# Patient Record
Sex: Female | Born: 1995 | Race: Black or African American | Hispanic: No | Marital: Single | Smoking: Never smoker
Health system: Southern US, Community
[De-identification: ages and names within clinical notes are randomized; demographics above are authoritative.]

---
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
## 2013-02-04 DIAGNOSIS — Z789 Other specified health status: Secondary | ICD-10-CM | POA: Insufficient documentation

## 2014-08-23 IMAGING — CT CT ABD-PELV W/ CM
2 of 4 series · 15 of 46 positions shown, 17 images · IV contrast (APPLIED)
Comparison: None.

CLINICAL DATA: Abdominal pain

EXAM:
CT ABDOMEN AND PELVIS WITH CONTRAST
TECHNIQUE: Multidetector CT imaging of the abdomen and pelvis was performed
using the standard protocol following bolus administration of
intravenous contrast.
CONTRAST:  100mL OMNIPAQUE IOHEXOL 300 MG/ML  SOLN

[Series 2: abd/ pelvis 5.0 i30f 1 · axial · 0.65mm/px · z∈[-461,-21]mm · 12 of 101 slices shown, 14 images]
[im 9/101  soft-tissue]
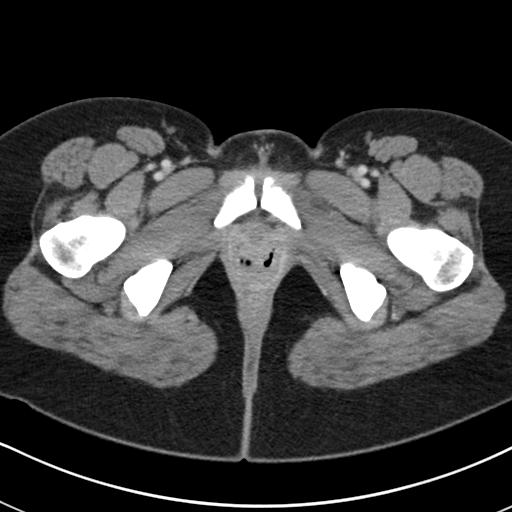
[im 9/101  bone]
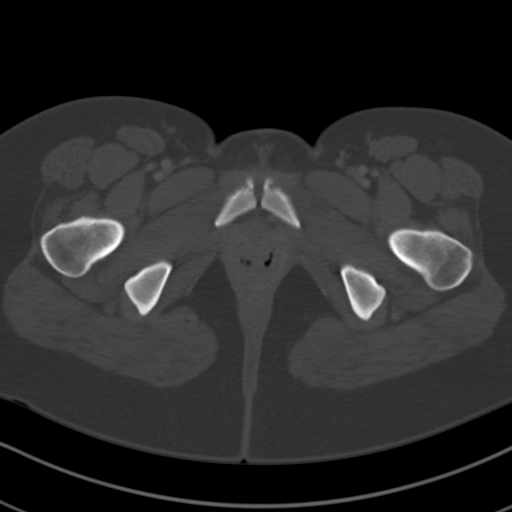
[im 17/101  soft-tissue]
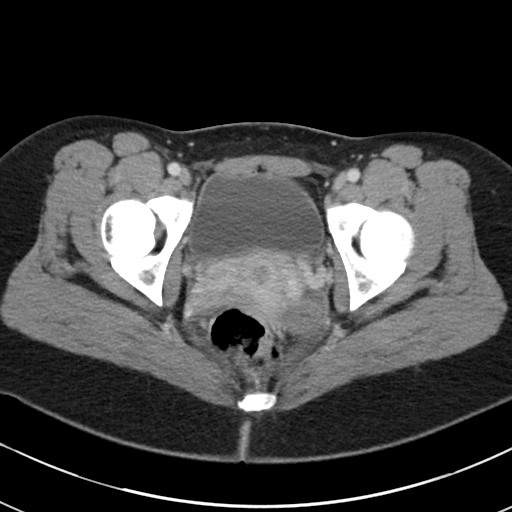
[im 25/101  soft-tissue]
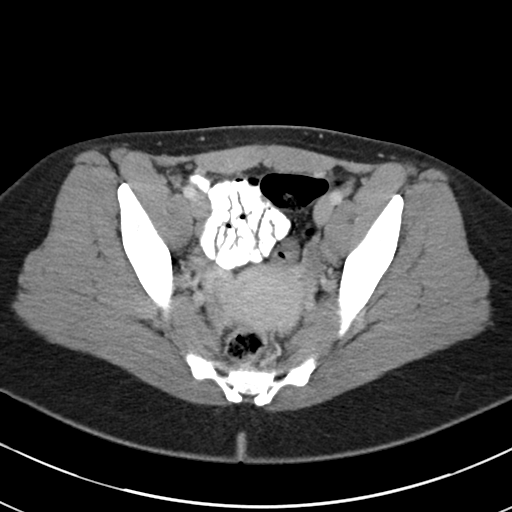
[im 33/101  soft-tissue]
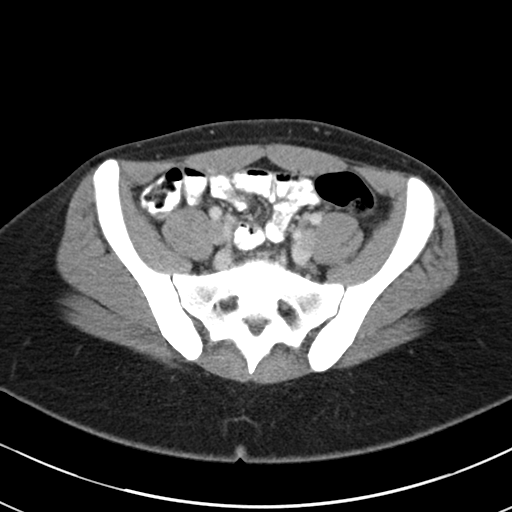
[im 41/101  soft-tissue]
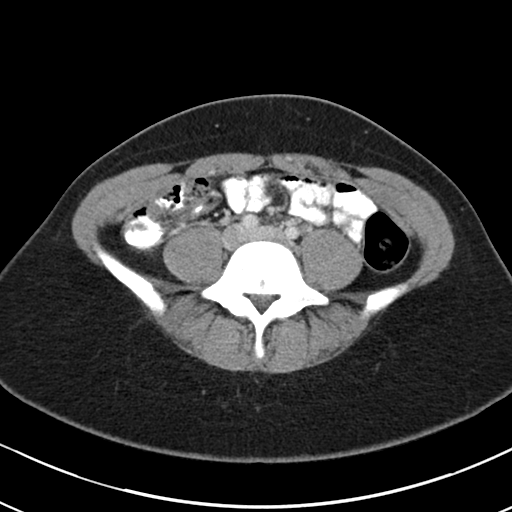
[im 49/101  soft-tissue]
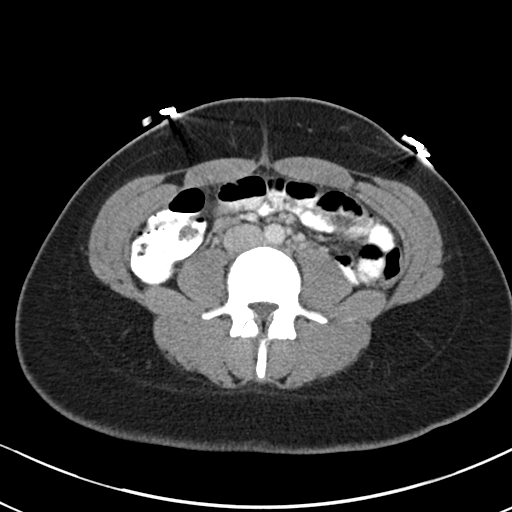
[im 57/101  soft-tissue]
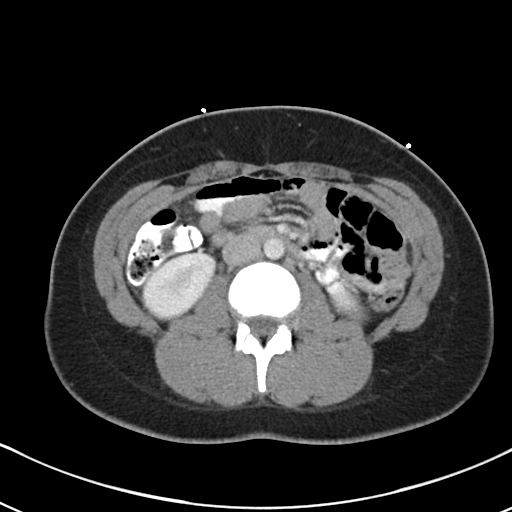
[im 65/101  soft-tissue]
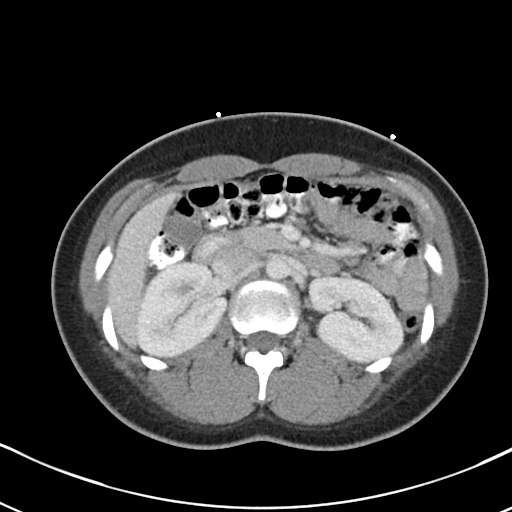
[im 73/101  soft-tissue]
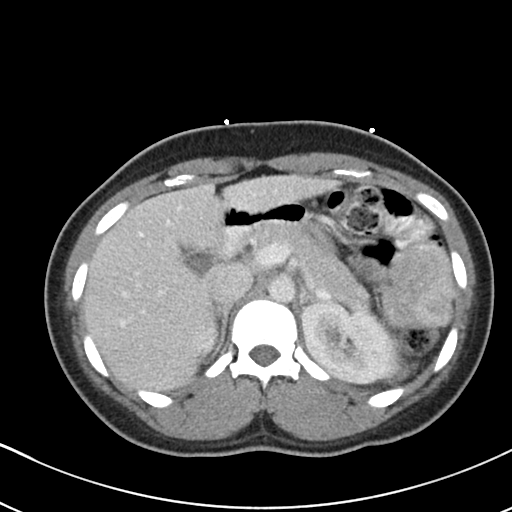
[im 73/101  bone]
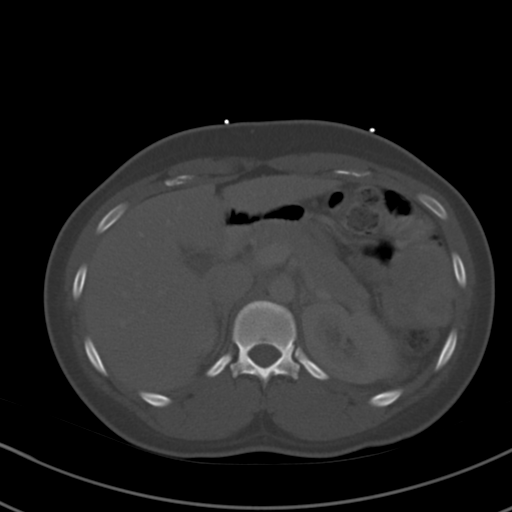
[im 81/101  soft-tissue]
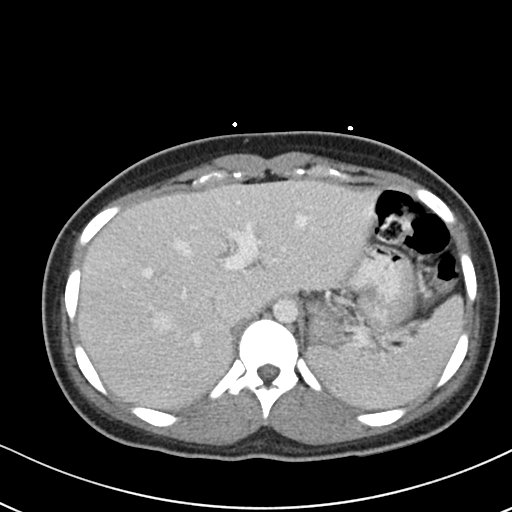
[im 89/101  soft-tissue]
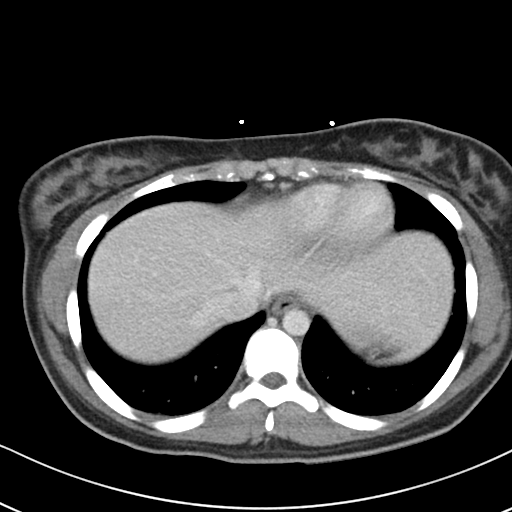
[im 97/101  soft-tissue]
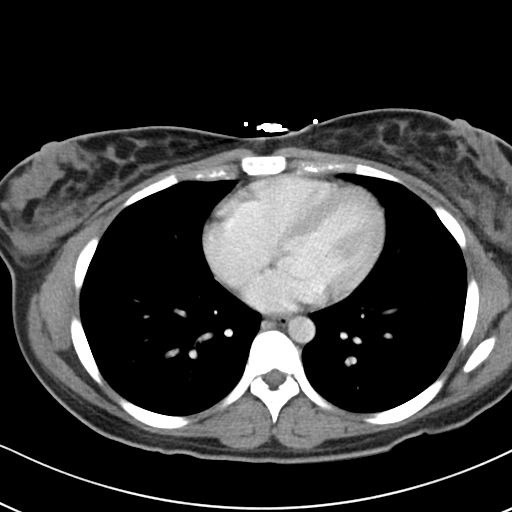

[Series 5: cor · coronal · 0.66mm/px · 3 of 99 slices shown]
[im 33/99  soft-tissue]
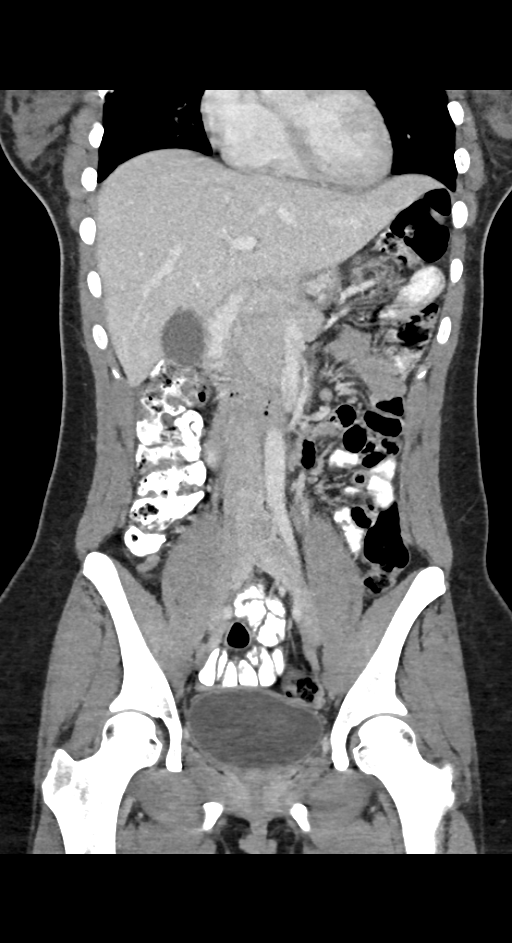
[im 44/99  soft-tissue]
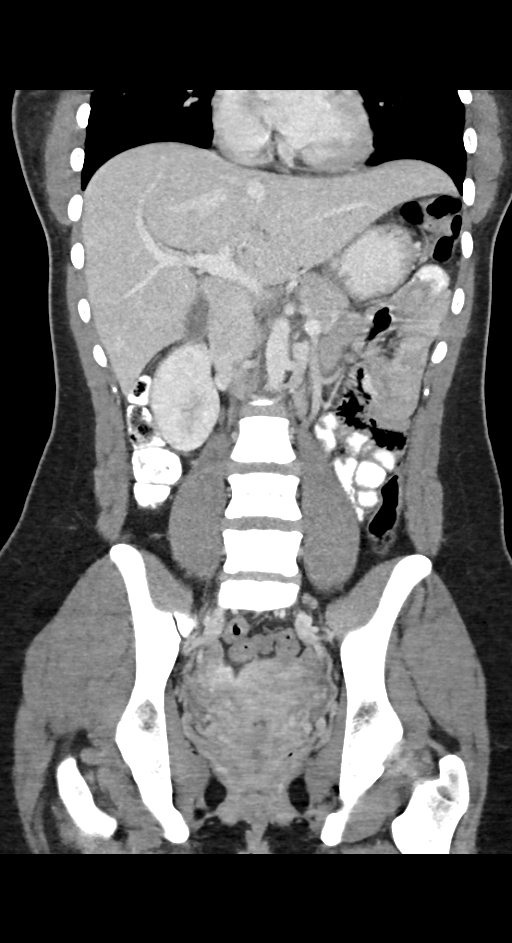
[im 55/99  soft-tissue]
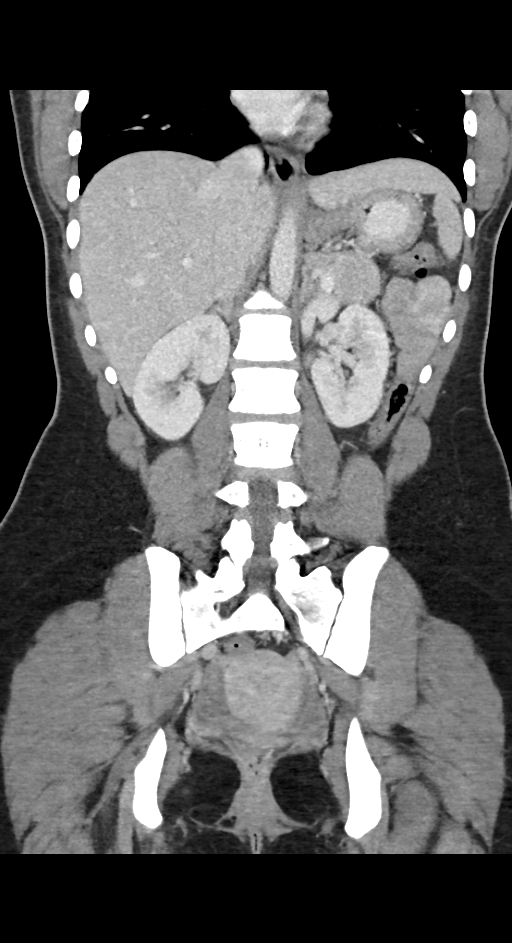

[15 of 46 positions shown; findings below may reference images not displayed]

FINDINGS: BODY WALL: Unremarkable.

LOWER CHEST:

Mediastinum: Unremarkable.

Lungs/pleura: No consolidation.

ABDOMEN/PELVIS:

Liver: No focal abnormality.

Biliary: No evidence of biliary obstruction or stone.

Pancreas: Unremarkable.

Spleen: Unremarkable.

Adrenals: Unremarkable.

Kidneys and ureters: No hydronephrosis or stone.

Bladder: Unremarkable.

Bowel: The sigmoid colon has borderline wall thickening, but there
is no history of diarrhea. No obstruction. Normal appendix.

Retroperitoneum: No mass or adenopathy.

Peritoneum: There is trace free pelvic fluid, often physiologic.

Reproductive: Unremarkable.

Vascular: No acute abnormality.

OSSEOUS: No acute abnormalities. Mild L5-S1 retrolisthesis.
IMPRESSION: Negative for acute intra-abdominal abnormality.

## 2014-08-23 IMAGING — US US ABDOMEN COMPLETE
1 series · 14 of 25 positions shown · non-contrast
Comparison: None.

CLINICAL DATA: Right upper quadrant pain

EXAM:
ULTRASOUND ABDOMEN COMPLETE

[Series 1: us abdomen complete · 0.20mm/px · 14 of 99 slices shown]
[im 1/99]
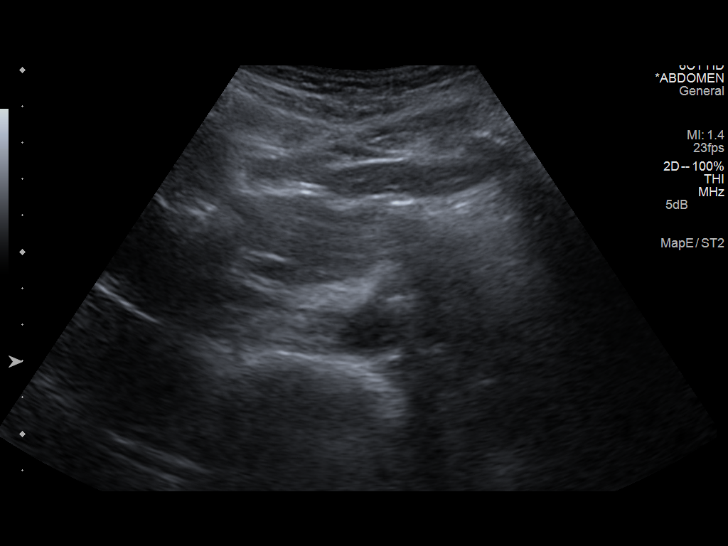
[im 9/99]
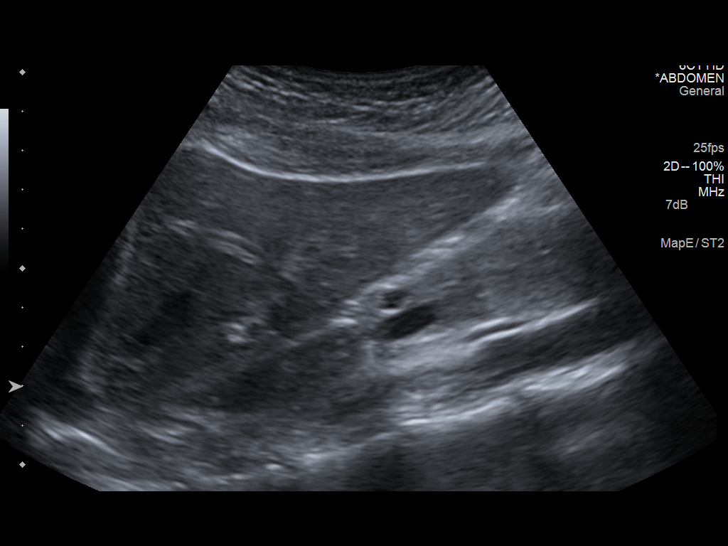
[im 17/99]
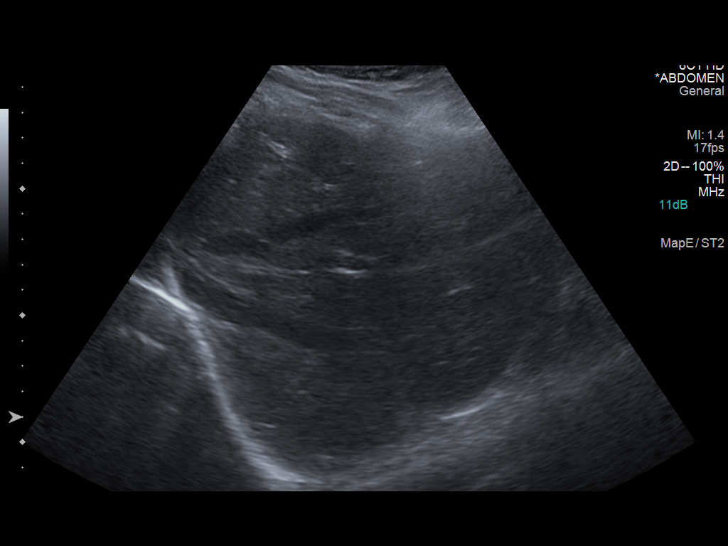
[im 25/99]
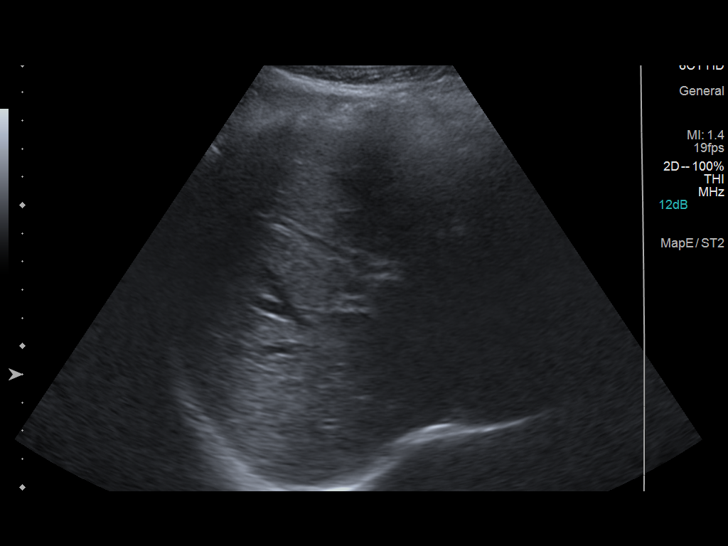
[im 33/99]
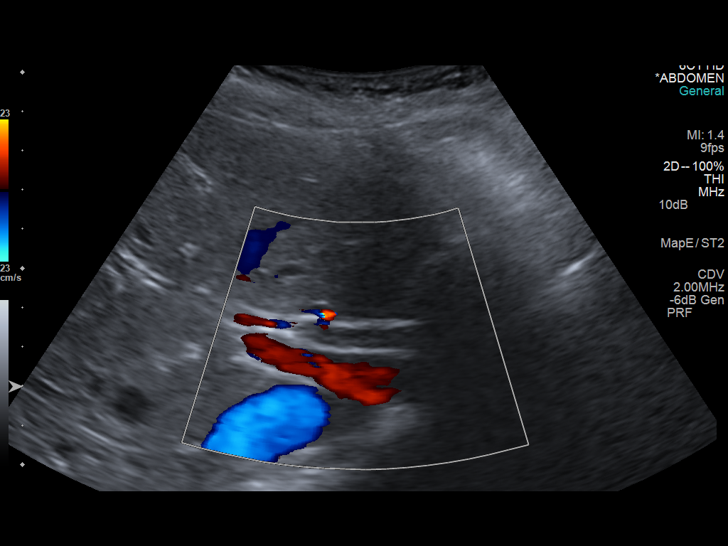
[im 37/99]
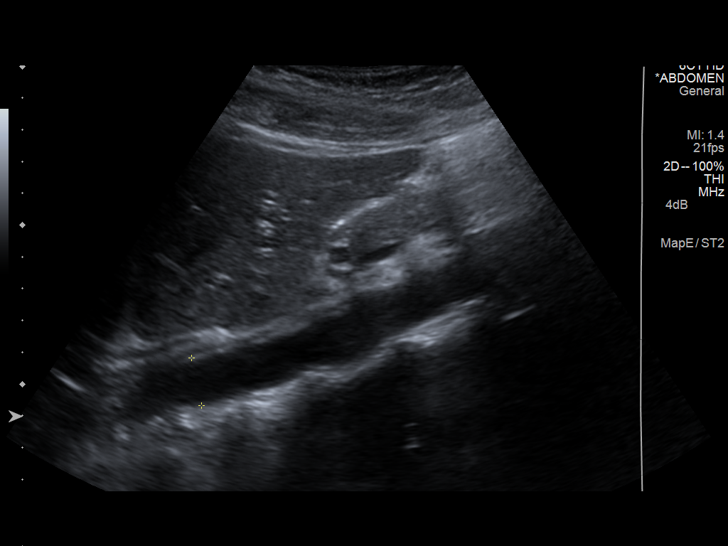
[im 45/99]
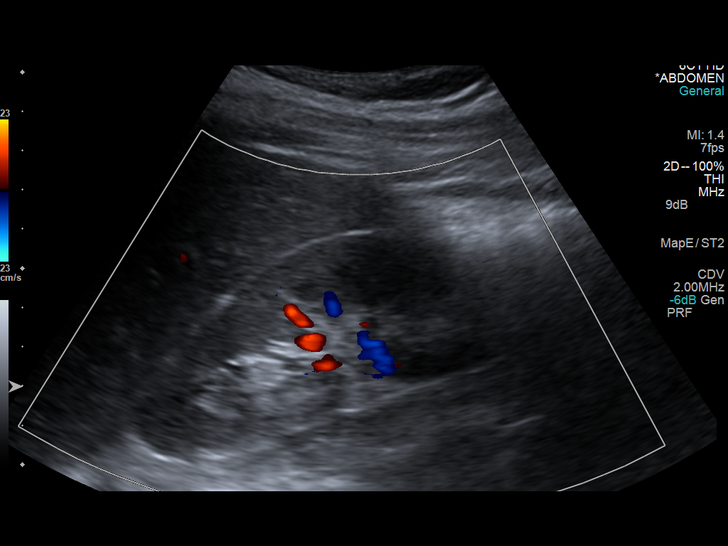
[im 54/99]
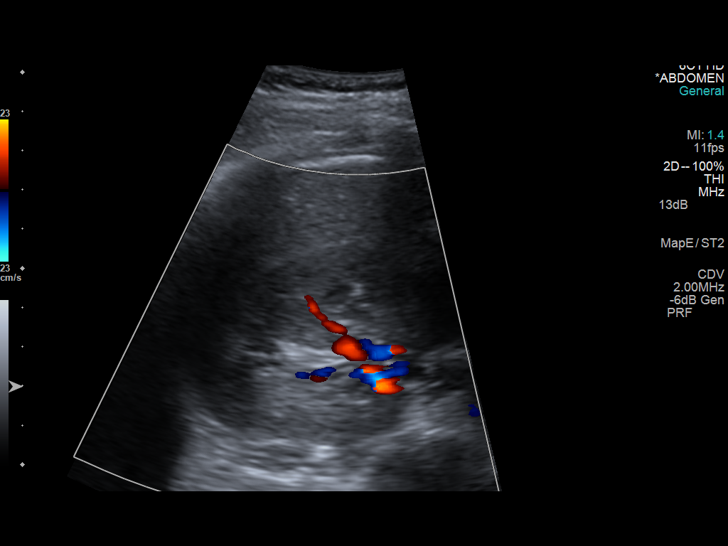
[im 62/99]
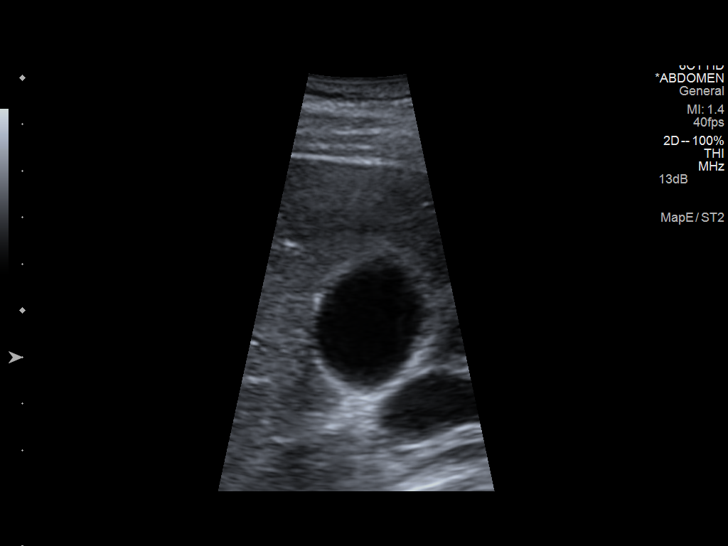
[im 66/99]
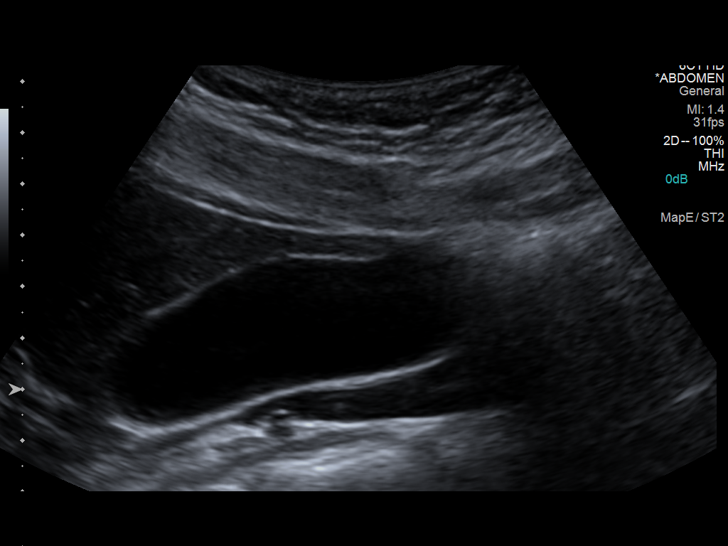
[im 74/99]
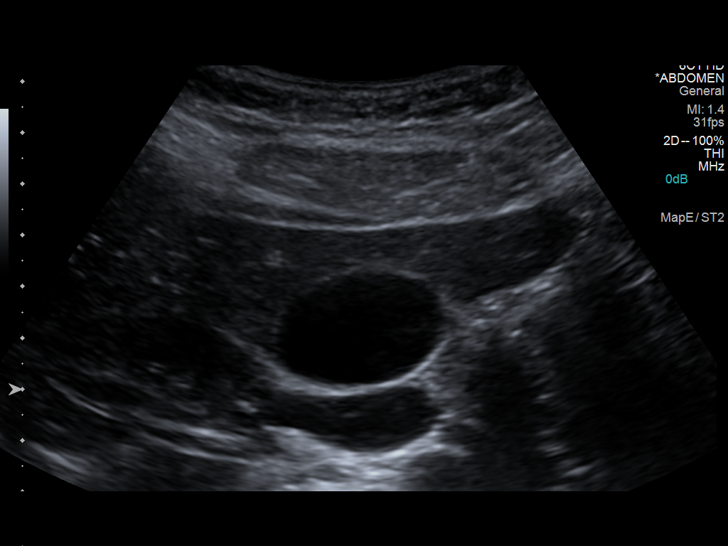
[im 82/99]
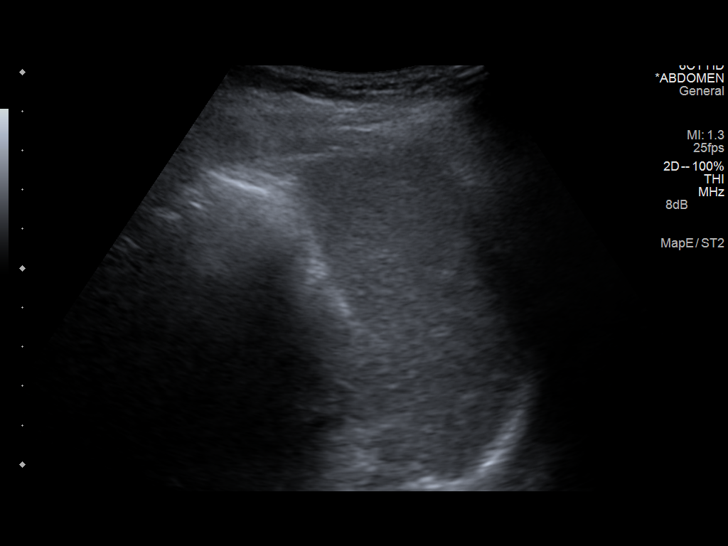
[im 90/99]
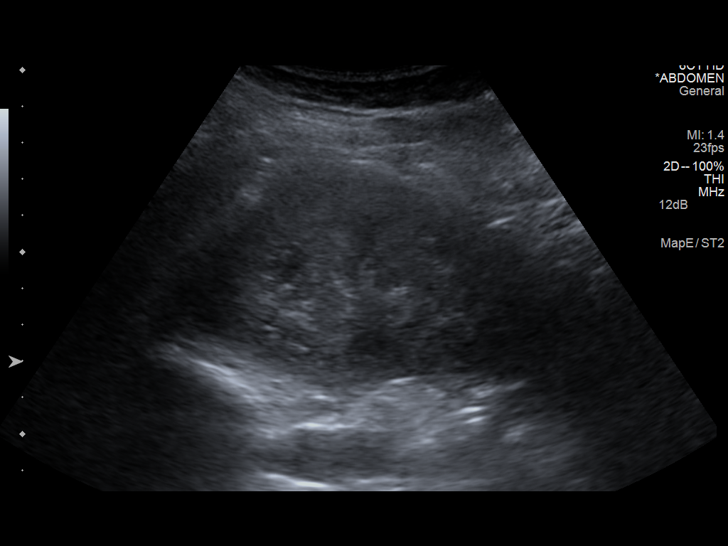
[im 99/99]
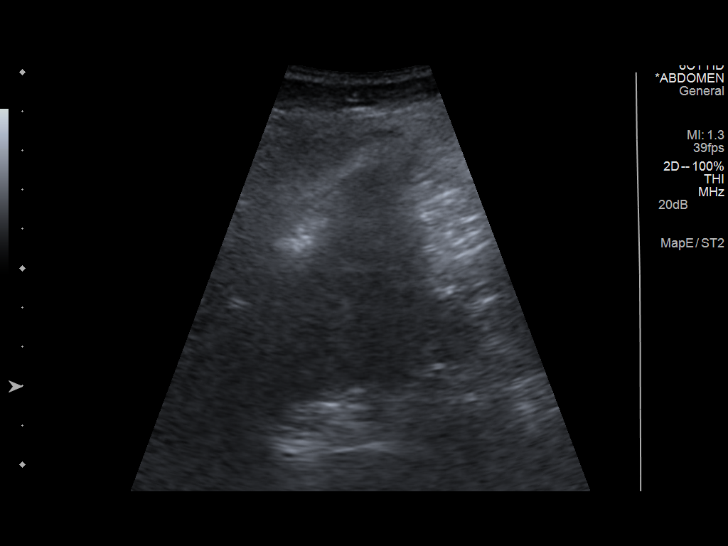

[14 of 25 positions shown; findings below may reference images not displayed]

FINDINGS: Gallbladder

No gallstones or wall thickening. Negative sonographic Murphy's
sign.

Common bile duct

Diameter: 4 mm

Liver

No focal lesion identified. Within normal limits in parenchymal
echogenicity.

IVC

No abnormality visualized.

Pancreas

Poorly visualized.

Spleen

Size and appearance within normal limits.

Right Kidney

Length: 11 cm Echogenicity within normal limits. No mass or
hydronephrosis visualized.

Left Kidney

Length: 11 cm. Echogenicity within normal limits. No mass or
hydronephrosis visualized.

Abdominal aorta

No aneurysm visualized.
IMPRESSION: Negative abdominal ultrasound.

## 2020-07-13 ENCOUNTER — Other Ambulatory Visit (HOSPITAL_COMMUNITY): Payer: Self-pay

## 2020-07-13 MED ORDER — CARESTART COVID-19 HOME TEST VI KIT
PACK | 0 refills | Status: AC
  Filled 2020-07-13: qty 2, 2d supply, fill #0

## 2021-03-15 ENCOUNTER — Telehealth: Payer: Self-pay | Admitting: *Deleted

## 2021-03-15 NOTE — Telephone Encounter (Signed)
Left patient a message with all office information for scheduled appointment on 03/28/2021 at 2:10 with an 1:55 PM arrival time. Also, patient informed to call office to update insurance information.

## 2021-03-28 ENCOUNTER — Encounter: Payer: 59 | Admitting: Obstetrics and Gynecology

## 2021-09-14 DIAGNOSIS — J301 Allergic rhinitis due to pollen: Secondary | ICD-10-CM | POA: Insufficient documentation

## 2021-11-07 DIAGNOSIS — L500 Allergic urticaria: Secondary | ICD-10-CM | POA: Insufficient documentation

## 2022-03-02 DIAGNOSIS — E66811 Obesity, class 1: Secondary | ICD-10-CM | POA: Insufficient documentation

## 2022-05-07 DIAGNOSIS — R635 Abnormal weight gain: Secondary | ICD-10-CM | POA: Insufficient documentation

## 2022-12-09 DIAGNOSIS — Z7689 Persons encountering health services in other specified circumstances: Secondary | ICD-10-CM | POA: Insufficient documentation

## 2024-03-04 ENCOUNTER — Ambulatory Visit
Admission: EM | Admit: 2024-03-04 | Discharge: 2024-03-04 | Disposition: A | Discharge status: Home / Self Care | Source: Home / Self Care

## 2024-03-04 ENCOUNTER — Ambulatory Visit

## 2024-03-04 DIAGNOSIS — J029 Acute pharyngitis, unspecified: Secondary | ICD-10-CM

## 2024-03-04 DIAGNOSIS — R051 Acute cough: Secondary | ICD-10-CM

## 2024-03-04 DIAGNOSIS — J111 Influenza due to unidentified influenza virus with other respiratory manifestations: Secondary | ICD-10-CM | POA: Diagnosis not present

## 2024-03-04 DIAGNOSIS — R509 Fever, unspecified: Secondary | ICD-10-CM | POA: Diagnosis not present

## 2024-03-04 LAB — POC SOFIA SARS ANTIGEN FIA: SARS Coronavirus 2 Ag: NEGATIVE

## 2024-03-04 LAB — POCT INFLUENZA A/B
Influenza A, POC: NEGATIVE
Influenza B, POC: NEGATIVE

## 2024-03-04 LAB — POCT RAPID STREP A (OFFICE): Rapid Strep A Screen: NEGATIVE

## 2024-03-04 MED ORDER — OSELTAMIVIR PHOSPHATE 75 MG PO CAPS: 75.0000 mg | ORAL_CAPSULE | Freq: Two times a day (BID) | ORAL | 0 refills | Status: AC

## 2024-03-04 MED ORDER — PROMETHAZINE-DM 6.25-15 MG/5ML PO SYRP: 5.0000 mL | ORAL_SOLUTION | Freq: Four times a day (QID) | ORAL | 0 refills | Status: AC | PRN

## 2024-03-04 MED ORDER — ACETAMINOPHEN 325 MG PO TABS
650.0000 mg | ORAL_TABLET | Freq: Once | ORAL | Status: AC
  Administered 2024-03-04: 650 mg via ORAL

## 2024-03-04 NOTE — Discharge Instructions (Signed)
-   Flu is negative but I believe this is a false negative test. Likely too soon to test positive. Negative COVID, strep and normal chest xray.  You are within the window for treatment Tamiflu  to potentially be helpful so I sent it to the pharmacy  - Sent cough medicine and Tamiflu . - You need to isolate until you are fever free for 24 hours and symptoms are improving. - Increase rest and fluids. - You should be seen again if you have uncontrolled fever, weakness or worsening breathing problem.

## 2024-03-04 NOTE — ED Provider Notes (Signed)
 " [facility]    CSN: [id number] Arrival date & time: 03/04/24  1007      History   Chief Complaint Chief Complaint  Patient presents with   Fever   Sore Throat   Fatigue    HPI Kristina Ashley is a 29 y.o. female presenting for fever, fatigue, cough, congestion, sore throat and body aches that began this morning. Denies ear pain, sinus pain, chest pain, wheezing, shortness of breath, abdominal pain, vomiting or diarrhea.  Patient has not been taking over-the-counter meds. No other complaints.   HPI  History reviewed. No pertinent past medical history.  Patient Active Problem List   Diagnosis Date Noted   Encounter for weight management 12/09/2022   Weight gain, abnormal 05/07/2022   Obesity (BMI 30.0-34.9) 03/02/2022   Allergic urticaria 11/07/2021   Hay fever 09/14/2021   No pertinent past medical history 02/04/2013    History reviewed. No pertinent surgical history.  OB History   No obstetric history on file.      Home Medications    Prior to Admission medications  Medication Sig Start Date End Date Taking? Authorizing Provider  azelastine (ASTELIN) 0.1 % nasal spray Place 2 sprays into both nostrils 2 (two) times daily as needed for Rhinitis 01/21/24 01/20/25 Yes [provider]  escitalopram (LEXAPRO) 10 MG tablet Take 10 mg by mouth daily. 02/11/24  Yes [provider]  montelukast (SINGULAIR) 10 MG tablet Take 1 tablet (10 mg total) by mouth once daily as needed 01/21/24 01/20/25 Yes [provider]  oseltamivir  (TAMIFLU ) 75 MG capsule Take 1 capsule (75 mg total) by mouth every 12 (twelve) hours for 5 days. 03/04/24 03/09/24 Yes Arvis Huxley B, PA-C  promethazine -dextromethorphan (PROMETHAZINE -DM) 6.25-15 MG/5ML syrup Take 5 mLs by mouth 4 (four) times daily as needed. 03/04/24  Yes Arvis Huxley NOVAK, PA-C  ZEPBOUND 12.5 MG/0.5ML Pen Inject 0.5 mLs (12.5 mg total) subcutaneously every 7 (seven) days Start after completing 4 weeks  of 10mg  dose 07/26/23  Yes [provider]  COVID-19 At Home Antigen Test [facility] COVID-19 HOME TEST) KIT use as directed 07/13/20   Roh, Jacqueline M, RPH  fexofenadine (ALLEGRA) 180 MG tablet Take 180 mg by mouth once daily    [provider]  SYEDA 3-0.03 MG tablet Take 1 tablet by mouth daily.    [provider]    Family History History reviewed. No pertinent family history.  Social History Social History[1]   Allergies   Patient has no known allergies.   Review of Systems Review of Systems  Constitutional:  Positive for chills, fatigue and fever. Negative for diaphoresis.  HENT:  Positive for congestion, rhinorrhea and sore throat. Negative for ear pain, sinus pressure and sinus pain.   Respiratory:  Positive for cough. Negative for shortness of breath.   Cardiovascular:  Negative for chest pain.  Gastrointestinal:  Negative for abdominal pain, nausea and vomiting.  Musculoskeletal:  Positive for myalgias.  Skin:  Negative for rash.  Neurological:  Positive for headaches. Negative for weakness.  Hematological:  Negative for adenopathy.     Physical Exam Triage Vital Signs ED Triage Vitals  Encounter Vitals Group     BP 03/04/24 1024 (!) 119/55     Girls Systolic BP Percentile --      Girls Diastolic BP Percentile --      Boys Systolic BP Percentile --      Boys Diastolic BP Percentile --      Pulse Rate 03/04/24 1024 (!)  132     Resp 03/04/24 1024 20     Temp 03/04/24 1024 (!) 102.9 F (39.4 C)     Temp Source 03/04/24 1024 Oral     SpO2 --      Weight 03/04/24 1024 182 lb (82.6 kg)     Height --      Head Circumference --      Peak Flow --      Pain Score 03/04/24 1030 5     Pain Loc --      Pain Education --      Exclude from Growth Chart --    No data found.  Updated Vital Signs BP (!) 119/55 (BP Location: Right Arm)   Pulse (!) 132   Temp (!) 102.9 F (39.4 C) (Oral)   Resp 20   Wt 182 lb (82.6 kg)   LMP 02/19/2024  (Approximate)   SpO2 97% Comment: By Barnie Paramedic    Physical Exam Vitals and nursing note reviewed.  Constitutional:      General: She is not in acute distress.    Appearance: Normal appearance. She is well-developed. She is ill-appearing. She is not toxic-appearing.  HENT:     Head: Normocephalic and atraumatic.     Nose: Congestion present.     Mouth/Throat:     Mouth: Mucous membranes are moist.     Pharynx: Oropharynx is clear. Posterior oropharyngeal erythema present.  Eyes:     General: No scleral icterus.       Right eye: No discharge.        Left eye: No discharge.     Conjunctiva/sclera: Conjunctivae normal.  Cardiovascular:     Rate and Rhythm: Regular rhythm. Tachycardia present.     Heart sounds: Normal heart sounds.  Pulmonary:     Effort: Pulmonary effort is normal. No respiratory distress.     Breath sounds: Normal breath sounds.  Musculoskeletal:     Cervical back: Neck supple.  Skin:    General: Skin is dry.  Neurological:     General: No focal deficit present.     Mental Status: She is alert. Mental status is at baseline.     Motor: No weakness.     Gait: Gait normal.  Psychiatric:        Mood and Affect: Mood normal.        Behavior: Behavior normal.      UC Treatments / Results  Labs (all labs ordered are listed, but only abnormal results are displayed) Labs Reviewed  POC SOFIA SARS ANTIGEN FIA - Normal  POCT INFLUENZA A/B - Normal  POCT RAPID STREP A (OFFICE) - Normal  CULTURE, GROUP A STREP [facility])    EKG   Radiology DG Chest 2 View Result Date: 03/04/2024 CLINICAL DATA:  Fever, fatigue, cough and chest congestion today EXAM: CHEST - 2 VIEW COMPARISON:  None available. FINDINGS: Cardiomediastinal silhouette and pulmonary vasculature are within normal limits. Lungs are clear. IMPRESSION: No acute cardiopulmonary process. Electronically Signed   By: Aliene Lloyd M.D.   On: 03/04/2024 11:25    Procedures Procedures (including  critical care time)  Medications Ordered in UC Medications  acetaminophen  (TYLENOL ) tablet 650 mg (650 mg Oral Given 03/04/24 1032)    Initial Impression / Assessment and Plan / UC Course  I have reviewed the triage vital signs and the nursing notes.  Pertinent labs & imaging results that were available during my care of the patient were reviewed by me and considered in  my medical decision making (see chart for details).   29 year old female presents for fever, fatigue, cough, congestion, sore throat, runny nose and bodyaches that began today.  Current temp 102.9 degrees.  Pulse elevated 132 bpm.  Patient is ill-appearing but nontoxic.  No acute distress.  On exam has nasal congestion and erythema of posterior pharynx.  Chest clear.  Heart regular rhythm.  Negative COVID, flu and strep testing. Strep culture sent.  If positive we will call and treat patient with antibiotics.  CXR obtained to rule out pneumonia. Negative.   Suspect false negative flu. Will treat for flu.  Sent Tamiflu  impressing DM.  Reviewed current CDC guidelines, isolation protocol and ED precautions for influenza.  Acute illness with systemic symptoms.     Final Clinical Impressions(s) / UC Diagnoses   Final diagnoses:  Fever, unspecified  Influenza-like illness  Sore throat  Acute cough     Discharge Instructions      - Flu is negative but I believe this is a false negative test. Likely too soon to test positive. Negative COVID, strep and normal chest xray.  You are within the window for treatment Tamiflu  to potentially be helpful so I sent it to the pharmacy  - Sent cough medicine and Tamiflu . - You need to isolate until you are fever free for 24 hours and symptoms are improving. - Increase rest and fluids. - You should be seen again if you have uncontrolled fever, weakness or worsening breathing problem.     ED Prescriptions     Medication Sig Dispense Auth. Provider   oseltamivir  (TAMIFLU ) 75 MG  capsule Take 1 capsule (75 mg total) by mouth every 12 (twelve) hours for 5 days. 10 capsule Arvis Huxley B, PA-C   promethazine -dextromethorphan (PROMETHAZINE -DM) 6.25-15 MG/5ML syrup Take 5 mLs by mouth 4 (four) times daily as needed. 118 mL Arvis Huxley NOVAK, PA-C      PDMP not reviewed this encounter.     [1]  Social History Tobacco Use   Smoking status: Never  Substance Use Topics   Alcohol use: No     Arvis Huxley NOVAK, PA-C 03/04/24 1139  "

## 2024-03-04 NOTE — ED Triage Notes (Signed)
 Pt c/o fever,sore throat,chest congestion & lethargic that started this AM. No OTC meds.

## 2024-03-06 LAB — CULTURE, GROUP A STREP (THRC)
